# Patient Record
Sex: Male | Born: 1970 | Race: Black or African American | Hispanic: No | Marital: Married | Smoking: Current every day smoker
Health system: Southern US, Community
[De-identification: ages and names within clinical notes are randomized; demographics above are authoritative.]

---

## 2019-05-19 ENCOUNTER — Ambulatory Visit (HOSPITAL_COMMUNITY)
Admission: EM | Admit: 2019-05-19 | Discharge: 2019-05-19 | Disposition: A | Discharge status: Home / Self Care | Payer: Worker's Compensation | Attending: Orthopedic Surgery | Admitting: Orthopedic Surgery

## 2019-05-19 ENCOUNTER — Emergency Department (HOSPITAL_COMMUNITY): Payer: Worker's Compensation | Admitting: Certified Registered"

## 2019-05-19 ENCOUNTER — Other Ambulatory Visit: Payer: Self-pay

## 2019-05-19 ENCOUNTER — Encounter (HOSPITAL_COMMUNITY): Payer: Self-pay | Admitting: Emergency Medicine

## 2019-05-19 ENCOUNTER — Emergency Department (HOSPITAL_COMMUNITY)
Admission: EM | Admit: 2019-05-19 | Discharge: 2019-05-20 | Discharge status: Absent without leave | Payer: Worker's Compensation | Source: Home / Self Care

## 2019-05-19 ENCOUNTER — Encounter (HOSPITAL_COMMUNITY)
Admission: EM | Disposition: A | Discharge status: Home / Self Care | Payer: Self-pay | Source: Home / Self Care | Attending: Emergency Medicine

## 2019-05-19 ENCOUNTER — Ambulatory Visit: Admit: 2019-05-19 | Payer: Self-pay | Admitting: Orthopedic Surgery

## 2019-05-19 ENCOUNTER — Emergency Department (HOSPITAL_COMMUNITY): Payer: Worker's Compensation

## 2019-05-19 DIAGNOSIS — Z1159 Encounter for screening for other viral diseases: Secondary | ICD-10-CM | POA: Insufficient documentation

## 2019-05-19 DIAGNOSIS — Y990 Civilian activity done for income or pay: Secondary | ICD-10-CM | POA: Diagnosis not present

## 2019-05-19 DIAGNOSIS — F17200 Nicotine dependence, unspecified, uncomplicated: Secondary | ICD-10-CM | POA: Insufficient documentation

## 2019-05-19 DIAGNOSIS — S68521A Partial traumatic transphalangeal amputation of right thumb, initial encounter: Secondary | ICD-10-CM | POA: Diagnosis not present

## 2019-05-19 DIAGNOSIS — S68522A Partial traumatic transphalangeal amputation of left thumb, initial encounter: Secondary | ICD-10-CM

## 2019-05-19 DIAGNOSIS — W312XXA Contact with powered woodworking and forming machines, initial encounter: Secondary | ICD-10-CM | POA: Insufficient documentation

## 2019-05-19 HISTORY — PX: AMPUTATION FINGER: SHX6594

## 2019-05-19 LAB — SARS CORONAVIRUS 2 BY RT PCR (HOSPITAL ORDER, PERFORMED IN ~~LOC~~ HOSPITAL LAB): SARS Coronavirus 2: NEGATIVE

## 2019-05-19 SURGERY — AMPUTATION, FINGER
Anesthesia: General | Site: Hand | Laterality: Right

## 2019-05-19 MED ORDER — OXYCODONE-ACETAMINOPHEN 5-325 MG PO TABS: 1.0000 | ORAL_TABLET | ORAL | 0 refills | Status: AC | PRN

## 2019-05-19 MED ORDER — HYDROMORPHONE HCL 1 MG/ML IJ SOLN
1.0000 mg | Freq: Once | INTRAMUSCULAR | Status: AC
  Administered 2019-05-19: 1 mg via INTRAVENOUS
  Filled 2019-05-19: qty 1

## 2019-05-19 MED ORDER — FENTANYL CITRATE (PF) 100 MCG/2ML IJ SOLN
INTRAMUSCULAR | Status: DC | PRN
  Administered 2019-05-19: 150 ug via INTRAVENOUS

## 2019-05-19 MED ORDER — DEXAMETHASONE SODIUM PHOSPHATE 10 MG/ML IJ SOLN
INTRAMUSCULAR | Status: AC
  Filled 2019-05-19: qty 1

## 2019-05-19 MED ORDER — CHLORHEXIDINE GLUCONATE 4 % EX LIQD
60.0000 mL | Freq: Once | CUTANEOUS | Status: DC
  Filled 2019-05-19: qty 60

## 2019-05-19 MED ORDER — FENTANYL CITRATE (PF) 100 MCG/2ML IJ SOLN
25.0000 ug | INTRAMUSCULAR | Status: DC | PRN
  Administered 2019-05-19 (×2): 50 ug via INTRAVENOUS

## 2019-05-19 MED ORDER — PROMETHAZINE HCL 25 MG/ML IJ SOLN: 6.2500 mg | INTRAMUSCULAR | Status: DC | PRN

## 2019-05-19 MED ORDER — ONDANSETRON HCL 4 MG/2ML IJ SOLN
INTRAMUSCULAR | Status: AC
  Filled 2019-05-19: qty 2

## 2019-05-19 MED ORDER — PROPOFOL 10 MG/ML IV BOLUS
INTRAVENOUS | Status: DC | PRN
  Administered 2019-05-19: 200 mg via INTRAVENOUS

## 2019-05-19 MED ORDER — PROPOFOL 10 MG/ML IV BOLUS
INTRAVENOUS | Status: AC
  Filled 2019-05-19: qty 20

## 2019-05-19 MED ORDER — KETOROLAC TROMETHAMINE 30 MG/ML IJ SOLN
30.0000 mg | Freq: Once | INTRAMUSCULAR | Status: AC | PRN
  Administered 2019-05-19: 30 mg via INTRAVENOUS

## 2019-05-19 MED ORDER — POVIDONE-IODINE 10 % EX SWAB: 2.0000 "application " | Freq: Once | CUTANEOUS | Status: DC

## 2019-05-19 MED ORDER — OXYCODONE HCL 5 MG PO TABS
ORAL_TABLET | ORAL | Status: AC
  Filled 2019-05-19: qty 1

## 2019-05-19 MED ORDER — CEFAZOLIN SODIUM-DEXTROSE 2-4 GM/100ML-% IV SOLN
2.0000 g | INTRAVENOUS | Status: AC
  Administered 2019-05-19: 17:00:00 2 g via INTRAVENOUS
  Filled 2019-05-19: qty 100

## 2019-05-19 MED ORDER — LIDOCAINE 2% (20 MG/ML) 5 ML SYRINGE
INTRAMUSCULAR | Status: AC
  Filled 2019-05-19: qty 5

## 2019-05-19 MED ORDER — BUPIVACAINE HCL (PF) 0.25 % IJ SOLN
INTRAMUSCULAR | Status: DC | PRN
  Administered 2019-05-19: 8 mL

## 2019-05-19 MED ORDER — CEPHALEXIN 500 MG PO CAPS: 500.0000 mg | ORAL_CAPSULE | Freq: Four times a day (QID) | ORAL | 0 refills | Status: AC

## 2019-05-19 MED ORDER — OXYCODONE HCL 5 MG PO TABS
5.0000 mg | ORAL_TABLET | Freq: Once | ORAL | Status: AC | PRN
  Administered 2019-05-19: 5 mg via ORAL

## 2019-05-19 MED ORDER — SODIUM CHLORIDE 0.9 % IR SOLN
Status: DC | PRN
  Administered 2019-05-19: 1000 mL

## 2019-05-19 MED ORDER — KETOROLAC TROMETHAMINE 30 MG/ML IJ SOLN
INTRAMUSCULAR | Status: AC
  Filled 2019-05-19: qty 1

## 2019-05-19 MED ORDER — MIDAZOLAM HCL 5 MG/5ML IJ SOLN
INTRAMUSCULAR | Status: DC | PRN
  Administered 2019-05-19: 2 mg via INTRAVENOUS

## 2019-05-19 MED ORDER — DEXAMETHASONE SODIUM PHOSPHATE 10 MG/ML IJ SOLN
INTRAMUSCULAR | Status: DC | PRN
  Administered 2019-05-19: 5 mg via INTRAVENOUS

## 2019-05-19 MED ORDER — BUPIVACAINE HCL (PF) 0.25 % IJ SOLN
INTRAMUSCULAR | Status: AC
  Filled 2019-05-19: qty 30

## 2019-05-19 MED ORDER — ONDANSETRON HCL 4 MG/2ML IJ SOLN
INTRAMUSCULAR | Status: DC | PRN
  Administered 2019-05-19: 4 mg via INTRAVENOUS

## 2019-05-19 MED ORDER — LACTATED RINGERS IV SOLN
INTRAVENOUS | Status: DC
  Administered 2019-05-19: 16:00:00 via INTRAVENOUS

## 2019-05-19 MED ORDER — MORPHINE SULFATE (PF) 2 MG/ML IV SOLN
4.0000 mg | Freq: Once | INTRAVENOUS | Status: AC
  Administered 2019-05-19: 14:00:00 4 mg via INTRAVENOUS
  Filled 2019-05-19: qty 2

## 2019-05-19 MED ORDER — FENTANYL CITRATE (PF) 250 MCG/5ML IJ SOLN
INTRAMUSCULAR | Status: AC
  Filled 2019-05-19: qty 5

## 2019-05-19 MED ORDER — MIDAZOLAM HCL 2 MG/2ML IJ SOLN
INTRAMUSCULAR | Status: AC
  Filled 2019-05-19: qty 2

## 2019-05-19 MED ORDER — LIDOCAINE HCL 2 % IJ SOLN
10.0000 mL | Freq: Once | INTRAMUSCULAR | Status: DC
  Filled 2019-05-19: qty 20

## 2019-05-19 MED ORDER — LIDOCAINE 2% (20 MG/ML) 5 ML SYRINGE
INTRAMUSCULAR | Status: DC | PRN
  Administered 2019-05-19: 80 mg via INTRAVENOUS

## 2019-05-19 MED ORDER — FENTANYL CITRATE (PF) 100 MCG/2ML IJ SOLN
INTRAMUSCULAR | Status: AC
  Filled 2019-05-19: qty 2

## 2019-05-19 MED ORDER — LIDOCAINE HCL (PF) 1 % IJ SOLN
INTRAMUSCULAR | Status: AC
  Administered 2019-05-19: 30 mL
  Filled 2019-05-19: qty 30

## 2019-05-19 MED ORDER — OXYCODONE HCL 5 MG/5ML PO SOLN: 5.0000 mg | Freq: Once | ORAL | Status: AC | PRN

## 2019-05-19 MED ORDER — ONDANSETRON HCL 4 MG/2ML IJ SOLN
4.0000 mg | Freq: Once | INTRAMUSCULAR | Status: AC
  Administered 2019-05-19: 4 mg via INTRAVENOUS
  Filled 2019-05-19: qty 2

## 2019-05-19 SURGICAL SUPPLY — 62 items
BANDAGE ACE 3X5.8 VEL STRL LF (GAUZE/BANDAGES/DRESSINGS) ×4 IMPLANT
BANDAGE ACE 4X5 VEL STRL LF (GAUZE/BANDAGES/DRESSINGS) ×4 IMPLANT
BNDG COHESIVE 1X5 TAN STRL LF (GAUZE/BANDAGES/DRESSINGS) IMPLANT
BNDG CONFORM 2 STRL LF (GAUZE/BANDAGES/DRESSINGS) IMPLANT
BNDG CONFORM 3 STRL LF (GAUZE/BANDAGES/DRESSINGS) ×4 IMPLANT
BNDG ELASTIC 2X5.8 VLCR STR LF (GAUZE/BANDAGES/DRESSINGS) ×4 IMPLANT
BNDG ESMARK 4X9 LF (GAUZE/BANDAGES/DRESSINGS) ×4 IMPLANT
BNDG GAUZE ELAST 4 BULKY (GAUZE/BANDAGES/DRESSINGS) ×4 IMPLANT
CORD BIPOLAR FORCEPS 12FT (ELECTRODE) ×4 IMPLANT
COVER SURGICAL LIGHT HANDLE (MISCELLANEOUS) ×4 IMPLANT
COVER WAND RF STERILE (DRAPES) IMPLANT
CUFF TOURN SGL QUICK 18X4 (TOURNIQUET CUFF) ×4 IMPLANT
CUFF TOURN SGL QUICK 24 (TOURNIQUET CUFF) ×2
CUFF TRNQT CYL 24X4X16.5-23 (TOURNIQUET CUFF) ×2 IMPLANT
DRAIN PENROSE 1/4X12 LTX STRL (WOUND CARE) IMPLANT
DRAPE SURG 17X23 STRL (DRAPES) ×4 IMPLANT
DRSG ADAPTIC 3X8 NADH LF (GAUZE/BANDAGES/DRESSINGS) ×4 IMPLANT
DRSG EMULSION OIL 3X3 NADH (GAUZE/BANDAGES/DRESSINGS) ×4 IMPLANT
DRSG TEGADERM 2-3/8X2-3/4 SM (GAUZE/BANDAGES/DRESSINGS) ×4 IMPLANT
ELECT REM PT RETURN 9FT ADLT (ELECTROSURGICAL)
ELECTRODE REM PT RTRN 9FT ADLT (ELECTROSURGICAL) IMPLANT
GAUZE SPONGE 4X4 12PLY STRL (GAUZE/BANDAGES/DRESSINGS) ×4 IMPLANT
GAUZE XEROFORM 1X8 LF (GAUZE/BANDAGES/DRESSINGS) ×4 IMPLANT
GAUZE XEROFORM 5X9 LF (GAUZE/BANDAGES/DRESSINGS) IMPLANT
GLOVE BIOGEL PI IND STRL 8.5 (GLOVE) ×2 IMPLANT
GLOVE BIOGEL PI INDICATOR 8.5 (GLOVE) ×2
GLOVE SURG ORTHO 8.0 STRL STRW (GLOVE) ×4 IMPLANT
GOWN STRL REUS W/ TWL LRG LVL3 (GOWN DISPOSABLE) ×6 IMPLANT
GOWN STRL REUS W/ TWL XL LVL3 (GOWN DISPOSABLE) ×2 IMPLANT
GOWN STRL REUS W/TWL LRG LVL3 (GOWN DISPOSABLE) ×6
GOWN STRL REUS W/TWL XL LVL3 (GOWN DISPOSABLE) ×2
HANDPIECE INTERPULSE COAX TIP (DISPOSABLE)
KIT BASIN OR (CUSTOM PROCEDURE TRAY) ×4 IMPLANT
KIT TURNOVER KIT B (KITS) ×4 IMPLANT
MANIFOLD NEPTUNE II (INSTRUMENTS) IMPLANT
NEEDLE HYPO 25GX1X1/2 BEV (NEEDLE) IMPLANT
NS IRRIG 1000ML POUR BTL (IV SOLUTION) ×4 IMPLANT
PACK ORTHO EXTREMITY (CUSTOM PROCEDURE TRAY) ×4 IMPLANT
PAD ARMBOARD 7.5X6 YLW CONV (MISCELLANEOUS) ×8 IMPLANT
PAD CAST 4YDX4 CTTN HI CHSV (CAST SUPPLIES) ×2 IMPLANT
PADDING CAST COTTON 4X4 STRL (CAST SUPPLIES) ×2
SET CYSTO W/LG BORE CLAMP LF (SET/KITS/TRAYS/PACK) IMPLANT
SET HNDPC FAN SPRY TIP SCT (DISPOSABLE) IMPLANT
SOAP 2 % CHG 4 OZ (WOUND CARE) ×4 IMPLANT
SPONGE LAP 18X18 RF (DISPOSABLE) ×4 IMPLANT
SPONGE LAP 4X18 RFD (DISPOSABLE) ×4 IMPLANT
SUCTION FRAZIER TIP 8 FR DISP (SUCTIONS) ×2
SUCTION TUBE FRAZIER 8FR DISP (SUCTIONS) ×2 IMPLANT
SUT CHROMIC 4 0 P 3 18 (SUTURE) ×8 IMPLANT
SUT ETHILON 4 0 PS 2 18 (SUTURE) IMPLANT
SUT ETHILON 5 0 P 3 18 (SUTURE)
SUT NYLON ETHILON 5-0 P-3 1X18 (SUTURE) IMPLANT
SWAB COLLECTION DEVICE MRSA (MISCELLANEOUS) ×4 IMPLANT
SWAB CULTURE ESWAB REG 1ML (MISCELLANEOUS) IMPLANT
SYR CONTROL 10ML LL (SYRINGE) IMPLANT
TOWEL GREEN STERILE (TOWEL DISPOSABLE) ×4 IMPLANT
TOWEL GREEN STERILE FF (TOWEL DISPOSABLE) ×4 IMPLANT
TUBE CONNECTING 12'X1/4 (SUCTIONS) ×1
TUBE CONNECTING 12X1/4 (SUCTIONS) ×3 IMPLANT
UNDERPAD 30X30 (UNDERPADS AND DIAPERS) ×4 IMPLANT
WATER STERILE IRR 1000ML POUR (IV SOLUTION) IMPLANT
YANKAUER SUCT BULB TIP NO VENT (SUCTIONS) IMPLANT

## 2019-05-19 NOTE — ED Triage Notes (Signed)
Pt was seen here earlier, needs a drug test for workers comp.

## 2019-05-19 NOTE — Op Note (Signed)
PREOPERATIVE DIAGNOSIS: Right thumb open distal phalanx fracture  POSTOPERATIVE DIAGNOSIS: Same  ATTENDING SURGEON: Dr. Iran Planas who scrubbed and present entire procedure procedure  ASSISTANT SURGEON: None  ANESTHESIA: General via endotracheal tube  OPERATIVE PROCEDURE: #1: Open treatment of right thumb open distal phalanx fracture with excisional debridement of the skin subcutaneous tissue and bone #2: Open treatment of right thumb distal phalanx fracture without internal fixation #3: Right thumb volar, Moberg advancement flap #4: Removal of right thumb nail plate  IMPLANTS: None  RADIOGRAPHIC INTERPRETATION: None  SURGICAL INDICATIONS: Patient is a right-hand-dominant gentleman who was at work and sustained an injury to his right thumb.  Patient was seen and evaluated and recommended undergo the above procedure.  The risks of surgery include but not limited to bleeding infection damage nearby nerves arteries or tendons loss of motion of the wrist and digits incomplete relief of symptoms and need for further surgical intervention.  SURGICAL TECHNIQUE: Patient was palpated via the preoperative holding area marked the permanent marker made on the right thumb to indicate the correct operative site.  Patient brought back to operating placed supine on the anesthesia table where the endotracheal anesthesia was administered.  Patient tolerated this well.  A well-padded tourniquet was then placed on the right brachium seal with the appropriate drape.  The right upper extremities and prepped and draped normal sterile fashion.  A timeout was called the correct site was identified the procedure then begun.  Attention was then turned to the thumb.  Tourniquet insufflated.  The nail plate was then completely removed.  After removal of the nail plate the bone was then debrided excisional debridement of skin subcutaneous tissue and bone.  Portion of the distal phalanx was then removed to even out the  oblique open distal phalanx fracture.  Open treatment of distal phalanx fracture was carried out without internal fixation.  Following this the patient was left with a defect.  A Moberg advancement flap was then elevated and advancing the volar skin to it toward dorsal direction.  Advancement flap was then able to be carried out.  The wound was then thoroughly irrigated.  After the flap was then advanced was sewn in place with chromic suture 4-0 chromic suture.  This closed nicely down to the nailbed.  The wound was thoroughly irrigated.  60 cc quarter percent Marcaine infiltrated local flexor sheath block.  The patient tolerated this well.  Adaptic dressing a sterile compressive bandage then applied.  The patient and explained taken recovery in good condition.  POSTOPERATIVE PLAN: Patient be discharged to home.  See him back in the office in 10 days for wound check x-rays down to see our therapist for tip protector splint begin working on outpatient therapy regimen turned to get back to mobility and function.

## 2019-05-19 NOTE — Anesthesia Preprocedure Evaluation (Addendum)
Anesthesia Evaluation  Patient identified by MRN, date of birth, ID band Patient awake    Reviewed: Allergy & Precautions, NPO status , Patient's Chart, lab work & pertinent test results  Airway Mallampati: I  TM Distance: >3 FB Neck ROM: Full    Dental no notable dental hx. (+) Dental Advisory Given, Teeth Intact   Pulmonary Current Smoker,    Pulmonary exam normal breath sounds clear to auscultation       Cardiovascular negative cardio ROS Normal cardiovascular exam Rhythm:Regular Rate:Normal     Neuro/Psych negative neurological ROS  negative psych ROS   GI/Hepatic negative GI ROS, Neg liver ROS,   Endo/Other  negative endocrine ROS  Renal/GU negative Renal ROS  negative genitourinary   Musculoskeletal negative musculoskeletal ROS (+)   Abdominal   Peds negative pediatric ROS (+)  Hematology negative hematology ROS (+)   Anesthesia Other Findings   Reproductive/Obstetrics negative OB ROS                            Anesthesia Physical Anesthesia Plan  ASA: II  Anesthesia Plan: General   Post-op Pain Management:    Induction: Intravenous  PONV Risk Score and Plan: 2 and Ondansetron, Dexamethasone and Treatment may vary due to age or medical condition  Airway Management Planned: LMA  Additional Equipment: None  Intra-op Plan:   Post-operative Plan: Extubation in OR  Informed Consent: I have reviewed the patients History and Physical, chart, labs and discussed the procedure including the risks, benefits and alternatives for the proposed anesthesia with the patient or authorized representative who has indicated his/her understanding and acceptance.     Dental advisory given  Plan Discussed with: CRNA and Anesthesiologist  Anesthesia Plan Comments:        Anesthesia Quick Evaluation

## 2019-05-19 NOTE — ED Notes (Signed)
Pt last ate at 0915 chicken salad sandwich  Last drink was mt dew at 0915

## 2019-05-19 NOTE — Discharge Instructions (Signed)
KEEP BANDAGE CLEAN AND DRY °CALL OFFICE FOR F/U APPT [phone] in 11 days °KEEP HAND ELEVATED ABOVE HEART °OK TO APPLY ICE TO OPERATIVE AREA °CONTACT OFFICE IF ANY WORSENING PAIN OR CONCERNS.  °
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
## 2019-05-19 NOTE — ED Notes (Signed)
Report called to Manuela Schwartz in [place]

## 2019-05-19 NOTE — Anesthesia Procedure Notes (Signed)
Procedure Name: LMA Insertion Date/Time: 05/19/2019 5:19 PM Performed by: Moshe Salisbury, CRNA Pre-anesthesia Checklist: Patient identified, Emergency Drugs available, Suction available and Patient being monitored Patient Re-evaluated:Patient Re-evaluated prior to induction Oxygen Delivery Method: Circle System Utilized Preoxygenation: Pre-oxygenation with 100% oxygen Induction Type: IV induction Ventilation: Mask ventilation without difficulty LMA: LMA inserted LMA Size: 5.0 Number of attempts: 1 Placement Confirmation: positive ETCO2 Tube secured with: Tape Dental Injury: Teeth and Oropharynx as per pre-operative assessment

## 2019-05-19 NOTE — Transfer of Care (Signed)
Immediate Anesthesia Transfer of Care Note  Patient: Lyndel Safe  Procedure(s) Performed: revision of right thumb Amputation (Right Hand)  Patient Location: PACU  Anesthesia Type:General  Level of Consciousness: awake and patient cooperative  Airway & Oxygen Therapy: Patient Spontanous Breathing and Patient connected to nasal cannula oxygen  Post-op Assessment: Report given to RN, Post -op Vital signs reviewed and stable and Patient moving all extremities  Post vital signs: Reviewed and stable  Last Vitals:  Vitals Value Taken Time  BP 136/95 05/19/19 1800  Temp    Pulse 67 05/19/19 1800  Resp 15 05/19/19 1800  SpO2 100 % 05/19/19 1800  Vitals shown include unvalidated device data.  Last Pain:  Vitals:   05/19/19 1537  TempSrc: Oral  PainSc:          Complications: No apparent anesthesia complications

## 2019-05-19 NOTE — ED Notes (Signed)
Consent signed.  Taken to short stay 39 at this time.  Unable to pull antibiotic.  Not available in the department.

## 2019-05-19 NOTE — ED Notes (Signed)
Pt seen leaving the ED and did not return.
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
## 2019-05-19 NOTE — ED Provider Notes (Signed)
[facility] EMERGENCY DEPARTMENT Provider Note   CSN: [id number] Arrival date & time: 05/19/19  1049    History   Chief Complaint No chief complaint on file.   HPI Darrell Brewer is a 48 y.o. male presents to the ER for evaluation of right thumb injury.  Patient was using a table saw and unfortunately lost his balance leaning forward and his thumb got caught under the table saw.  The fingertip is likely amputated.  They rinsed the fingertip and wrapped it.  No other interventions.  No alleviating factors.  Has associated severe sharp and stinging pain, bleeding.  He is right-hand dominant.     HPI  History reviewed. No pertinent past medical history.  There are no active problems to display for this patient.   ** The histories are not reviewed yet. Please review them in the "History" navigator section and refresh this [place].      Home Medications    Prior to Admission medications   Not on File    Family History No family history on file.  Social History Social History   Tobacco Use  . Smoking status: Current Every Day Smoker  . Smokeless tobacco: Never Used  Substance Use Topics  . Alcohol use: Not Currently  . Drug use: Never     Allergies   Patient has no known allergies.   Review of Systems Review of Systems  Musculoskeletal: Positive for arthralgias.  Skin: Positive for wound.  All other systems reviewed and are negative.    Physical Exam Updated Vital Signs BP (!) 152/87   Pulse 62   Temp 98.5 F (36.9 C) (Oral)   Resp 16   Ht 5\' 7"  (1.702 m)   Wt 65.8 kg   SpO2 97%   BMI 22.71 kg/m   Physical Exam Constitutional:      Appearance: He is well-developed.  HENT:     Head: Normocephalic.     Nose: Nose normal.  Eyes:     General: Lids are normal.  Neck:     Musculoskeletal: Normal range of motion.  Cardiovascular:     Rate and Rhythm: Normal rate.  Pulmonary:     Effort: Pulmonary effort is normal. No  respiratory distress.  Musculoskeletal: Normal range of motion.        General: Deformity present.     Comments: Left thumb tip is partially amputated at an angle mostly radial aspect involving 1/2 of the finger nail, bone is exposed. Oozing dark blood from wound. No focal bony tenderness to the IP joints or base of thumb.   Neurological:     Mental Status: He is alert.     Comments: Sensation to sharp and dull, two-point discrimination is intact in the remaining right thumb fingertip.  Psychiatric:        Behavior: Behavior normal.      ED Treatments / Results  Labs (all labs ordered are listed, but only abnormal results are displayed) Labs Reviewed  SARS CORONAVIRUS 2 ([place] [place] [facility])    EKG None  Radiology Dg Finger Thumb Right  Result Date: 05/19/2019 CLINICAL DATA:  Status post amputation of left thumb. EXAM: RIGHT THUMB 2+V COMPARISON:  None. FINDINGS: Status post surgical amputation of distal soft tissues and distal tuft of first distal phalanx is noted. Joint spaces are intact. Small focal densities are seen in the wound concerning for possible debris. IMPRESSION: Status post surgical amputation of distal soft tissues and distal  tuft of first distal phalanx. Possible debris is seen within the wound. Electronically Signed   By: Lupita RaiderJames  Green Jr M.D.   On: 05/19/2019 11:40    Procedures .Nerve Block  Date/Time: 05/19/2019 12:39 PM Performed by: Liberty HandyGibbons, Drake Wuertz J, PA-C Authorized by: Liberty HandyGibbons, Cayce Paschal J, PA-C   Consent:    Consent obtained:  Verbal   Consent given by:  Patient   Risks discussed:  Infection, bleeding, swelling, unsuccessful block, intravenous injection, pain and nerve damage   Alternatives discussed:  Alternative treatment, referral and delayed treatment Indications:    Indications:  Pain relief Location:    Body area:  Upper extremity   Upper extremity nerve:  Metacarpal   Laterality:  Right Pre-procedure  details:    Skin preparation:  Alcohol Skin anesthesia (see MAR for exact dosages):    Skin anesthesia method:  Local infiltration   Local anesthetic:  Lidocaine 2% w/o epi Procedure details (see MAR for exact dosages):    Block needle gauge:  25 G   Anesthetic injected:  Lidocaine 2% w/o epi   Steroid injected:  None   Additive injected:  None   Injection procedure:  Anatomic landmarks identified, incremental injection, negative aspiration for blood, introduced needle and anatomic landmarks palpated   Paresthesia:  None Post-procedure details:    Dressing:  None   Outcome:  Anesthesia achieved   Patient tolerance of procedure:  Tolerated well, no immediate complications   (including critical care time)  Medications Ordered in ED Medications  lidocaine (XYLOCAINE) 2 % (with pres) injection 200 mg (has no administration in time range)  chlorhexidine (HIBICLENS) 4 % liquid 4 application (has no administration in time range)  povidone-iodine 10 % swab 2 application (has no administration in time range)  ceFAZolin (ANCEF) IVPB 2g/100 mL premix (has no administration in time range)  lidocaine (PF) (XYLOCAINE) 1 % injection (30 mLs  Given 05/19/19 1156)  HYDROmorphone (DILAUDID) injection 1 mg (1 mg Intravenous Given 05/19/19 1156)     Initial Impression / Assessment and Plan / ED Course  I have reviewed the triage vital signs and the nursing notes.  Pertinent labs & imaging results that were available during my care of the patient were reviewed by me and considered in my medical decision making (see chart for details).  Clinical Course as of May 19 1239  Fri May 19, 2019  1145 Status post surgical amputation of distal soft tissues and distal tuft of first distal phalanx. Possible debris is seen within the wound.  DG Finger Thumb Right [CG]    Clinical Course User Index [CG] Liberty HandyGibbons, Reggie Welge J, PA-C   Thumb appears to be neurovascularly intact on exam.  Bleeding is controlled.  No  other physical injuries from the incident.  X-rays reviewed and interpreted by me and radiologist.  There is amputation at the distal tuft of the first distal phalanx.  Patient has received Dilaudid for pain control.  I have done a digital block to control his pain, prior to this he was neurovascularly intact.  Patient seen emergently in the ER by orthopedics who recommended revision/washout later tonight by Dr. Orlan Leavensrtman. COVID test pending. Pt to remain NPO. Last ate/drank 0915.   Final Clinical Impressions(s) / ED Diagnoses   Final diagnoses:  Partial traumatic amputation of left thumb through phalanx, initial encounter    ED Discharge Orders    None       Liberty HandyGibbons, Tally Mattox J, PA-C 05/19/19 1240    Jacalyn LefevreHaviland, Julie, MD 05/22/19 [phone]

## 2019-05-20 NOTE — Anesthesia Postprocedure Evaluation (Signed)
Anesthesia Post Note  Patient: Darrell Brewer  Procedure(s) Performed: revision of right thumb Amputation (Right Hand)     Patient location during evaluation: PACU Anesthesia Type: General Level of consciousness: awake and alert Pain management: pain level controlled Vital Signs Assessment: post-procedure vital signs reviewed and stable Respiratory status: spontaneous breathing, nonlabored ventilation, respiratory function stable and patient connected to nasal cannula oxygen Cardiovascular status: blood pressure returned to baseline and stable Postop Assessment: no apparent nausea or vomiting Anesthetic complications: no    Last Vitals:  Vitals:   05/19/19 1915 05/19/19 1934  BP: (!) 146/97 (!) 143/93  Pulse: (!) 55 (!) 55  Resp: 15 16  Temp:  (!) 36.3 C  SpO2: 99% 100%    Last Pain:  Vitals:   05/19/19 1915  TempSrc:   PainSc: 6                  Ltanya Bayley S

## 2019-05-21 ENCOUNTER — Encounter (HOSPITAL_COMMUNITY): Payer: Self-pay | Admitting: Orthopedic Surgery

## 2019-09-24 ENCOUNTER — Encounter (HOSPITAL_COMMUNITY): Payer: Self-pay

## 2019-09-24 ENCOUNTER — Emergency Department (HOSPITAL_COMMUNITY)
Admission: EM | Admit: 2019-09-24 | Discharge: 2019-09-25 | Disposition: A | Discharge status: Home / Self Care | Payer: Self-pay | Attending: Emergency Medicine | Admitting: Emergency Medicine

## 2019-09-24 ENCOUNTER — Other Ambulatory Visit: Payer: Self-pay

## 2019-09-24 DIAGNOSIS — F17210 Nicotine dependence, cigarettes, uncomplicated: Secondary | ICD-10-CM | POA: Insufficient documentation

## 2019-09-24 DIAGNOSIS — F1910 Other psychoactive substance abuse, uncomplicated: Secondary | ICD-10-CM | POA: Insufficient documentation

## 2019-09-24 MED ORDER — SODIUM CHLORIDE 0.9 % IV SOLN: 1000.0000 mL | INTRAVENOUS | Status: DC

## 2019-09-24 MED ORDER — NALOXONE HCL 0.4 MG/ML IJ SOLN: 0.4000 mg | INTRAMUSCULAR | Status: DC | PRN

## 2019-09-24 MED ORDER — SODIUM CHLORIDE 0.9 % IV BOLUS (SEPSIS)
1000.0000 mL | Freq: Once | INTRAVENOUS | Status: AC
  Administered 2019-09-25: 1000 mL via INTRAVENOUS

## 2019-09-24 NOTE — ED Notes (Signed)
Placed pt on 2L O2  Due to O2 sat @ 90% on RA
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
## 2019-09-25 LAB — CBC WITH DIFFERENTIAL/PLATELET
Abs Immature Granulocytes: 0.03 10*3/uL (ref 0.00–0.07)
Basophils Absolute: 0.1 10*3/uL (ref 0.0–0.1)
Basophils Relative: 1 %
Eosinophils Absolute: 0.3 10*3/uL (ref 0.0–0.5)
Eosinophils Relative: 3 %
HCT: 45.1 % (ref 39.0–52.0)
Hemoglobin: 14.8 g/dL (ref 13.0–17.0)
Immature Granulocytes: 0 %
Lymphocytes Relative: 18 %
Lymphs Abs: 1.7 10*3/uL (ref 0.7–4.0)
MCH: 32.7 pg (ref 26.0–34.0)
MCHC: 32.8 g/dL (ref 30.0–36.0)
MCV: 99.6 fL (ref 80.0–100.0)
Monocytes Absolute: 0.6 10*3/uL (ref 0.1–1.0)
Monocytes Relative: 6 %
Neutro Abs: 6.9 10*3/uL (ref 1.7–7.7)
Neutrophils Relative %: 72 %
Platelets: 184 10*3/uL (ref 150–400)
RBC: 4.53 MIL/uL (ref 4.22–5.81)
RDW: 12.9 % (ref 11.5–15.5)
WBC: 9.6 10*3/uL (ref 4.0–10.5)
nRBC: 0 % (ref 0.0–0.2)

## 2019-09-25 LAB — CBG MONITORING, ED: Glucose-Capillary: 109 mg/dL — ABNORMAL HIGH (ref 70–99)

## 2019-09-25 LAB — ETHANOL: Alcohol, Ethyl (B): 178 mg/dL — ABNORMAL HIGH (ref <10)

## 2019-09-25 LAB — ACETAMINOPHEN LEVEL: Acetaminophen (Tylenol), Serum: <10 ug/mL — ABNORMAL LOW (ref 10–30)

## 2019-09-25 LAB — SALICYLATE LEVEL: Salicylate Lvl: <7 mg/dL (ref 2.8–30.0)

## 2020-08-10 IMAGING — CR RIGHT THUMB 2+V
3 series · 3 of 3 positions shown · non-contrast
Comparison: None.

CLINICAL DATA: Status post amputation of left thumb.

EXAM:
RIGHT THUMB 2+V

[finger ap]
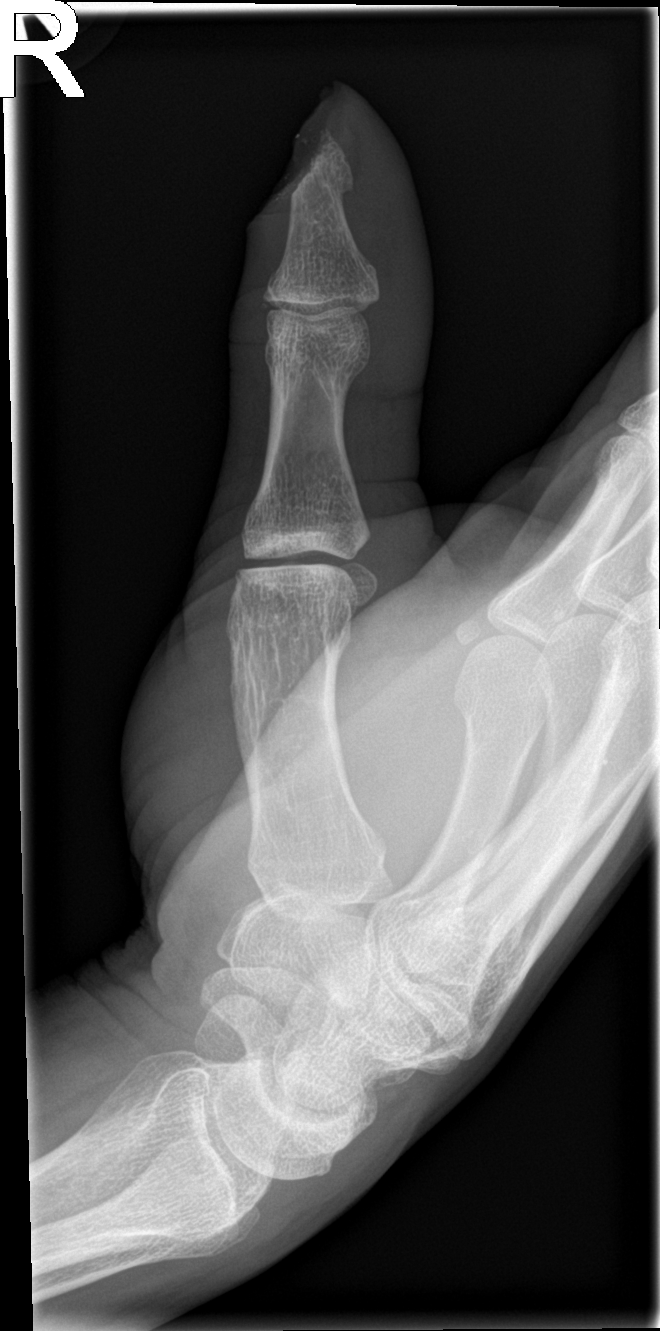

[finger obl]
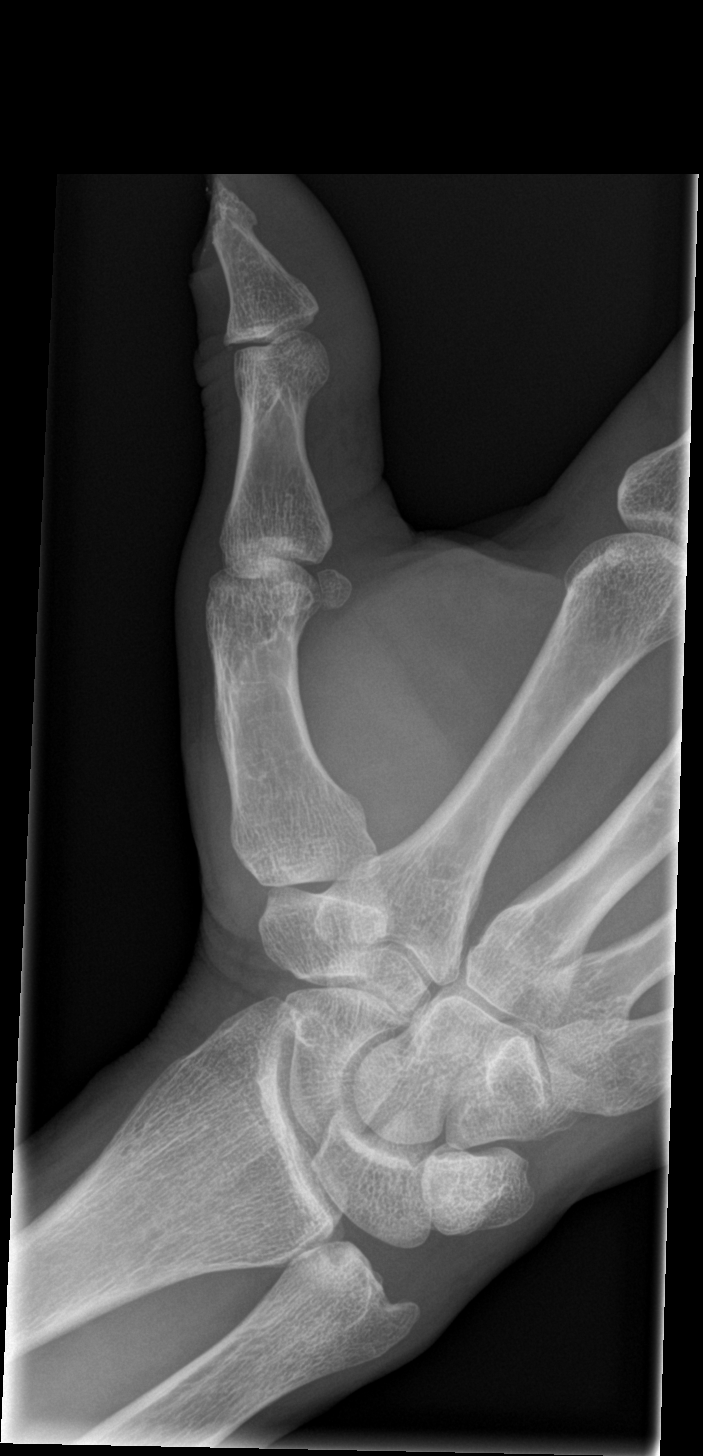

[finger lat]
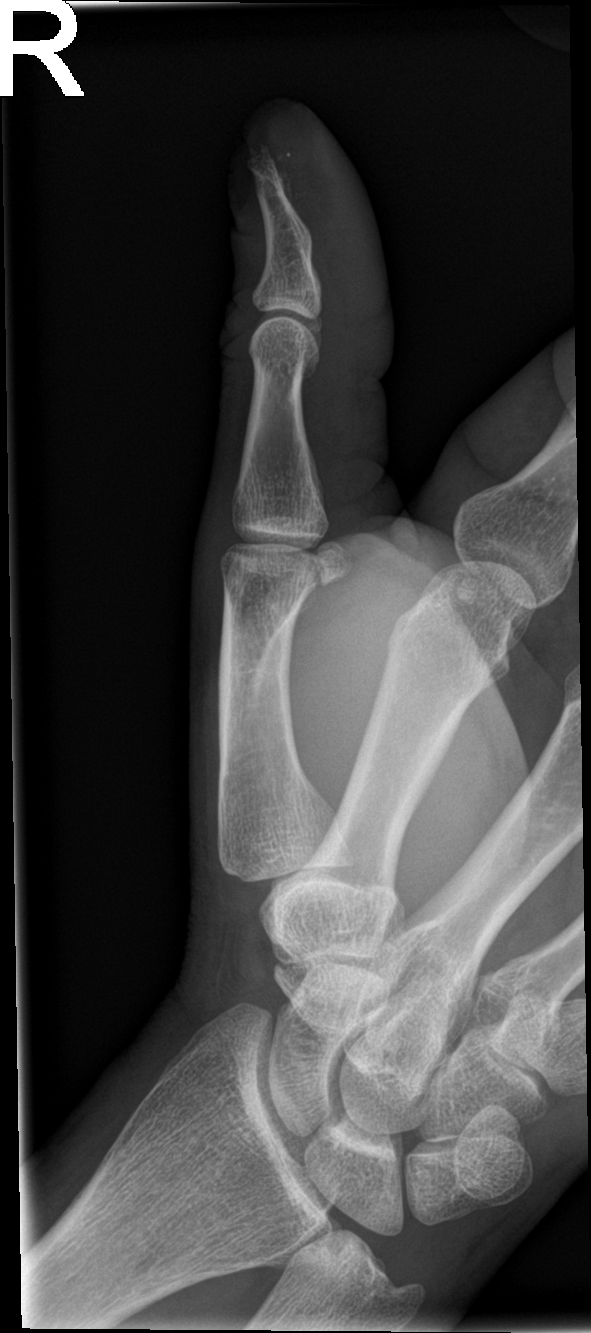

[3 of 3 positions shown; findings below may reference images not displayed]

FINDINGS: Status post surgical amputation of distal soft tissues and distal
tuft of first distal phalanx is noted. Joint spaces are intact.
Small focal densities are seen in the wound concerning for possible
debris.
IMPRESSION: Status post surgical amputation of distal soft tissues and distal
tuft of first distal phalanx. Possible debris is seen within the
wound.
